# Patient Record
Sex: Female | Born: 1945 | Race: Asian | Hispanic: No | Smoking: Never smoker
Health system: Southern US, Community
[De-identification: ages and names within clinical notes are randomized; demographics above are authoritative.]

## PROBLEM LIST (undated history)

## (undated) DIAGNOSIS — K219 Gastro-esophageal reflux disease without esophagitis: Secondary | ICD-10-CM

## (undated) DIAGNOSIS — I10 Essential (primary) hypertension: Secondary | ICD-10-CM

## (undated) DIAGNOSIS — E785 Hyperlipidemia, unspecified: Secondary | ICD-10-CM

---

## 2018-06-18 ENCOUNTER — Emergency Department (HOSPITAL_COMMUNITY): Payer: Medicare Other

## 2018-06-18 ENCOUNTER — Observation Stay (HOSPITAL_COMMUNITY)
Admission: EM | Admit: 2018-06-18 | Discharge: 2018-06-19 | Disposition: A | Discharge status: Home / Self Care | Payer: Medicare Other | Attending: Internal Medicine | Admitting: Internal Medicine

## 2018-06-18 ENCOUNTER — Encounter (HOSPITAL_COMMUNITY): Payer: Self-pay | Admitting: *Deleted

## 2018-06-18 ENCOUNTER — Other Ambulatory Visit: Payer: Self-pay

## 2018-06-18 DIAGNOSIS — K219 Gastro-esophageal reflux disease without esophagitis: Secondary | ICD-10-CM | POA: Diagnosis not present

## 2018-06-18 DIAGNOSIS — E785 Hyperlipidemia, unspecified: Secondary | ICD-10-CM | POA: Diagnosis not present

## 2018-06-18 DIAGNOSIS — I10 Essential (primary) hypertension: Secondary | ICD-10-CM | POA: Diagnosis not present

## 2018-06-18 DIAGNOSIS — Z79899 Other long term (current) drug therapy: Secondary | ICD-10-CM | POA: Diagnosis not present

## 2018-06-18 DIAGNOSIS — E7800 Pure hypercholesterolemia, unspecified: Secondary | ICD-10-CM

## 2018-06-18 DIAGNOSIS — R0602 Shortness of breath: Secondary | ICD-10-CM | POA: Insufficient documentation

## 2018-06-18 DIAGNOSIS — R079 Chest pain, unspecified: Secondary | ICD-10-CM | POA: Diagnosis present

## 2018-06-18 DIAGNOSIS — R001 Bradycardia, unspecified: Secondary | ICD-10-CM | POA: Insufficient documentation

## 2018-06-18 DIAGNOSIS — R0789 Other chest pain: Secondary | ICD-10-CM

## 2018-06-18 DIAGNOSIS — I4510 Unspecified right bundle-branch block: Secondary | ICD-10-CM | POA: Diagnosis not present

## 2018-06-18 HISTORY — DX: Essential (primary) hypertension: I10

## 2018-06-18 HISTORY — DX: Hyperlipidemia, unspecified: E78.5

## 2018-06-18 HISTORY — DX: Gastro-esophageal reflux disease without esophagitis: K21.9

## 2018-06-18 LAB — BASIC METABOLIC PANEL
Anion gap: 11 (ref 5–15)
BUN: 15 mg/dL (ref 8–23)
CO2: 23 mmol/L (ref 22–32)
CREATININE: 0.8 mg/dL (ref 0.44–1.00)
Calcium: 9.6 mg/dL (ref 8.9–10.3)
Chloride: 103 mmol/L (ref 98–111)
Glucose, Bld: 111 mg/dL — ABNORMAL HIGH (ref 70–99)
POTASSIUM: 3.7 mmol/L (ref 3.5–5.1)
SODIUM: 137 mmol/L (ref 135–145)

## 2018-06-18 LAB — CBC
HCT: 37.2 % (ref 36.0–46.0)
Hemoglobin: 12 g/dL (ref 12.0–15.0)
MCH: 26.8 pg (ref 26.0–34.0)
MCHC: 32.3 g/dL (ref 30.0–36.0)
MCV: 83.2 fL (ref 78.0–100.0)
PLATELETS: 255 10*3/uL (ref 150–400)
RBC: 4.47 MIL/uL (ref 3.87–5.11)
RDW: 12.5 % (ref 11.5–15.5)
WBC: 5.9 10*3/uL (ref 4.0–10.5)

## 2018-06-18 LAB — I-STAT TROPONIN, ED
TROPONIN I, POC: 0 ng/mL (ref 0.00–0.08)
Troponin i, poc: 0 ng/mL (ref 0.00–0.08)

## 2018-06-18 LAB — HEMOGLOBIN A1C
HEMOGLOBIN A1C: 5.3 % (ref 4.8–5.6)
MEAN PLASMA GLUCOSE: 105.41 mg/dL

## 2018-06-18 MED ORDER — ENOXAPARIN SODIUM 40 MG/0.4ML ~~LOC~~ SOLN
40.0000 mg | SUBCUTANEOUS | Status: DC
  Administered 2018-06-18: 40 mg via SUBCUTANEOUS
  Filled 2018-06-18 (×2): qty 0.4

## 2018-06-18 MED ORDER — AMLODIPINE BESYLATE 5 MG PO TABS
5.0000 mg | ORAL_TABLET | Freq: Every day | ORAL | Status: DC
  Administered 2018-06-19: 5 mg via ORAL
  Filled 2018-06-18: qty 1

## 2018-06-18 MED ORDER — GI COCKTAIL ~~LOC~~
30.0000 mL | Freq: Once | ORAL | Status: AC
  Administered 2018-06-18: 30 mL via ORAL
  Filled 2018-06-18: qty 30

## 2018-06-18 MED ORDER — PANTOPRAZOLE SODIUM 40 MG PO TBEC
40.0000 mg | DELAYED_RELEASE_TABLET | Freq: Every day | ORAL | Status: DC
  Administered 2018-06-19: 40 mg via ORAL
  Filled 2018-06-18: qty 1

## 2018-06-18 MED ORDER — LOSARTAN POTASSIUM 50 MG PO TABS
100.0000 mg | ORAL_TABLET | Freq: Every day | ORAL | Status: DC
  Administered 2018-06-19: 100 mg via ORAL
  Filled 2018-06-18 (×2): qty 2

## 2018-06-18 MED ORDER — ATORVASTATIN CALCIUM 20 MG PO TABS
10.0000 mg | ORAL_TABLET | Freq: Every day | ORAL | Status: DC
  Administered 2018-06-19: 10 mg via ORAL
  Filled 2018-06-18: qty 1

## 2018-06-18 MED ORDER — NITROGLYCERIN 0.4 MG SL SUBL
0.4000 mg | SUBLINGUAL_TABLET | Freq: Once | SUBLINGUAL | Status: AC
  Administered 2018-06-18: 0.4 mg via SUBLINGUAL
  Filled 2018-06-18: qty 1

## 2018-06-18 MED ORDER — HYDROCHLOROTHIAZIDE 12.5 MG PO CAPS
12.5000 mg | ORAL_CAPSULE | Freq: Every day | ORAL | Status: DC
  Administered 2018-06-19: 12.5 mg via ORAL
  Filled 2018-06-18: qty 1

## 2018-06-18 MED ORDER — LOSARTAN POTASSIUM-HCTZ 100-12.5 MG PO TABS: 1.0000 | ORAL_TABLET | Freq: Every day | ORAL | Status: DC

## 2018-06-18 NOTE — Progress Notes (Signed)
Patient to unit from ED and denies current chest pain or headache. She is accompanied by her daughter and both were oriented to unit and call bell. CHG bath given, telemetry applied and CCMD notified. Patient speaks some english but is mostly given prompts and explanation by her daughter who is supportive and helpful. They deny questions and patient resting in bed.

## 2018-06-18 NOTE — Plan of Care (Signed)

## 2018-06-18 NOTE — ED Provider Notes (Signed)
[facility] EMERGENCY DEPARTMENT Provider Note   CSN: [id number] Arrival date & time: 06/18/18  1202     History   Chief Complaint Chief Complaint  Patient presents with  . Chest Pain    HPI Natalie Dorsey is a 72 y.o. female.  HPI 72 year old female with history of hypertension, hyperlipidemia, and GERD presents to the emergency department today for evaluation of chest tightness and shortness of breath.  She is accompanied by her daughter who helps to translate for her.  Patient and daughter declined any formal translator.  She has been having chest tightness for the past 3 or 4 days.  Is typically active and works out in her yard.  She has been having shortness of breath with exertion recently.  Tightness radiates to her left shoulder.  Has had no nausea, vomiting or diaphoresis associated with this.  No abdominal pain. No leg swelling.  No history of DVT or PE.  No history of malignancy.  Has not been compliant with her home medications including atorvastatin, amlodipine, and losartan.  No cough or fever.  No sick exposures.  Did travel to [place] approximately 1 month ago and drove by car.  No pleuritic pain.  Nothing seems to make it better.  Exertion makes it worse.  Does not smoke.  No history of pulmonary disease including COPD. States does not feel like her reflux. Lives with daughter now. No family physician in this area.   Past Medical History:  Diagnosis Date  . GERD (gastroesophageal reflux disease)   . Hyperlipemia   . Hypertension     There are no active problems to display for this patient.   History reviewed. No pertinent surgical history.   OB History   None      Home Medications    Prior to Admission medications   Not on File    Family History History reviewed. No pertinent family history.  Social History Social History   Tobacco Use  . Smoking status: Never Smoker  . Smokeless tobacco: Never Used  Substance Use Topics    . Alcohol use: Not on file  . Drug use: Not on file     Allergies   Patient has no known allergies.   Review of Systems Review of Systems  Constitutional: Negative for chills and fever.  HENT: Negative for congestion and sore throat.   Eyes: Negative for visual disturbance.  Respiratory: Positive for shortness of breath. Negative for cough.   Cardiovascular: Positive for chest pain (tightness throughout). Negative for palpitations and leg swelling.  Gastrointestinal: Negative for abdominal pain, diarrhea, nausea and vomiting.  Genitourinary: Negative for dysuria and hematuria.  Musculoskeletal: Negative for back pain and neck pain.  Skin: Negative for color change and rash.  Neurological: Negative for weakness and headaches.  All other systems reviewed and are negative.    Physical Exam Updated Vital Signs BP (!) 111/54   Pulse (!) 55   Temp 97.8 F (36.6 C) (Oral)   Resp 15   Ht 5\' 2"  (1.575 m)   Wt 54.4 kg   SpO2 95%   BMI 21.95 kg/m   Physical Exam  Constitutional: She appears well-developed and well-nourished.  Non-toxic appearance. She does not appear ill. No distress.  HENT:  Head: Normocephalic and atraumatic.  Eyes: Conjunctivae are normal.  Neck: Neck supple.  Cardiovascular: Regular rhythm and intact distal pulses. Exam reveals no gallop and no friction rub.  No murmur heard. Pulmonary/Chest: Effort normal and breath sounds normal.  No respiratory distress. She has no decreased breath sounds. She has no wheezes. She has no rales.  Abdominal: Soft. She exhibits no distension. There is no tenderness.  Musculoskeletal: She exhibits no edema.       Right lower leg: She exhibits no edema.       Left lower leg: She exhibits no edema.  Neurological: She is alert.  Skin: Skin is warm and dry.  Psychiatric: She has a normal mood and affect.  Nursing note and vitals reviewed.    ED Treatments / Results  Labs (all labs ordered are listed, but only abnormal  results are displayed) Labs Reviewed  BASIC METABOLIC PANEL - Abnormal; Notable for the following components:      Result Value   Glucose, Bld 111 (*)    All other components within normal limits  CBC  I-STAT TROPONIN, ED  I-STAT TROPONIN, ED    EKG EKG Interpretation  Date/Time:  Tuesday June 18 2018 12:11:44 EDT Ventricular Rate:  71 PR Interval:  206 QRS Duration: 98 QT Interval:  406 QTC Calculation: 441 R Axis:   -26 Text Interpretation:  Normal sinus rhythm Incomplete right bundle branch block Nonspecific T wave abnormality Abnormal ECG No old tracing to compare Confirmed by Eber HongMiller, Brian ([id number]) on 06/18/2018 2:56:01 PM   Radiology Dg Chest 2 View  Result Date: 06/18/2018 CLINICAL DATA:  Chest pain, palpitations, left shoulder pain intermittently for the past several days with increased severity today associated with shortness of breath. Nonsmoker. EXAM: CHEST - 2 VIEW COMPARISON:  None. FINDINGS: The lungs are well-expanded and clear. The heart and pulmonary vascularity are normal. The mediastinum is normal in width. There is calcification in the wall of the aortic arch. There is no pleural effusion. The bony thorax exhibits no acute abnormality. IMPRESSION: There is no active cardiopulmonary disease. Electronically Signed   By: David  SwazilandJordan M.D.   On: 06/18/2018 13:10    Procedures Procedures (including critical care time)  Medications Ordered in ED Medications  nitroGLYCERIN (NITROSTAT) SL tablet 0.4 mg (has no administration in time range)  gi cocktail (Maalox,Lidocaine,Donnatal) (has no administration in time range)     Initial Impression / Assessment and Plan / ED Course  I have reviewed the triage vital signs and the nursing notes.  Pertinent labs & imaging results that were available during my care of the patient were reviewed by me and considered in my medical decision making (see chart for details).    72 year old female with history of hypertension,  hyperlipidemia, and GERD presents to the emergency department today for evaluation of chest tightness and shortness of breath.  Patient afebrile and hemodynamically stable at presentation.  Continuing to complain of chest tightness.  She is clear to auscultation and moving good air bilaterally.  I do not think patient is having any sort of reactive airway disease or COPD.  Carries no formal lung diagnoses or COPD.  Does not smoke.  ECG reviewed that shows right approximate 70, sinus rhythm, normal axis, intervals within normal limits, no significant ST elevations or depressions.  Does have nonspecific T wave abnormality's particularly in lead V3 and V2.  No other significant antibodies appreciated.  No prior ECGs available for comparison.  Patient is high risk by HEART pathway score of 5 and has had no provocative testing such as stress testing in the past.  Will obtain delta troponins and plan for more provocative testing pending work-up.  Chest x-ray was reviewed with no acute cardiac or pulmonary  antibodies.  No focal consolidation and no infectious signs or symptoms to suggest pneumonia.  No pneumothorax.  No effusions.  No widening of mediastinum.  History and exam not consistent with aortic dissection.  She has no leg swelling, no tachycardia, no tachypnea, no hypoxia or other findings and atypical presentation for PE.  Very low suspicion for PE at this time.  Labs from quick look process reviewed including CBC and BMP each of which are unremarkable.  Troponin obtained that is undetectable.  Delta troponin obtained and also remains undetectable.  No clear etiology for patient's symptoms at this time.  Will admit for further work-up given age and comorbidities with high risk HEART pathway for ACS.   Case and plan of care discussed with Dr. Hyacinth MeekerMiller.   Final Clinical Impressions(s) / ED Diagnoses   Final diagnoses:  Shortness of breath  Chest tightness    ED Discharge Orders    None         Rigoberto Noelickens, Jisele Price, MD 06/18/18 2008    Eber HongMiller, Brian, MD 06/19/18 1019

## 2018-06-18 NOTE — ED Provider Notes (Signed)
I saw and evaluated the patient, reviewed the resident's note and I agree with the findings and plan.  Pertinent History: Heart Pathway of 5, no hx of provocative testing, has had some intermittent SOB, and CP with radiation to arms.  States that she does have acid reflux but this feels different, she does not have radiation to the arms with the acid reflux.  She does have active acid reflux as well and is taking acid reflux medications intermittently.  The daughter who is an additional historian states that the patient has been living with her since December and only over the last couple of weeks and started to complain of increased amounts of chest tightness with shortness of breath and radiation of the left shoulder.  Sometimes it is at night, sometimes during the day, not exactly exertional either.  Pertinent Exam findings: mild bradycarida, no murmurs, no RUQ ttp, no edema, no JVD  EKG shows some non specific T waves.  There is nothing on my exam of concern however she does have a high heart pathway score and increased risk and likely would need provocative testing prior to discharge.  I personally interpreted the EKG as well as the resident and agree with the interpretation on the resident's chart.  Final diagnoses:  Shortness of breath  Chest tightness      Eber HongMiller, Abbeygail Igoe, MD 06/19/18 1019

## 2018-06-18 NOTE — ED Notes (Signed)
Pt denies any further CP at this time  

## 2018-06-18 NOTE — ED Triage Notes (Signed)
Pt in c/o chest pain with palpitations and left shoulder pain that has been intermittent for the last few days, worse today, reports shortness of breath as well, no distress noted

## 2018-06-18 NOTE — H&P (Signed)
Date: 06/18/2018               Patient Name:  Natalie Dorsey Tibbett MRN: [id number]  DOB: [birth year] Age / Sex: 72 y.o., female   PCP: Patient, No Pcp Per         Medical Service: Internal Medicine Teaching Service         Attending Physician: Dr. Doneen PoissonKlima, Lawrence, MD    First Contact: Dr. Karilyn Cotaehman Pager: [phone]  Second Contact: Dr. Caron PresumeHelberg Pager: [phone]       After Hours (After 5p/  First Contact Pager: [phone]  weekends / holidays): Second Contact Pager: [phone]   Chief Complaint: Chest tightness  History of Present Illness:   [name], Quintin AltoLakhone is a Laotian speaking 72 yo F w/ PMH of GERD, HLD, HTN presenting to ED with complain of chest tightness. Patient was interviewed in ED with her daughter acting as interpreter. Chest tightness started 4 days prior while she was working in the garden. She is usually able to tolerate exercise w/o chest pain, dyspnea or palpitations. Works in the garden for 2-3 hours without rest. She tried taking her reflux medication (Prevacid 30mg ) and her pain resolved. Since then she had no reoccurence until Monday night when she laid down to watch TV after a late meal. She experienced her chest tightness again and came to the ED today because she was concerned. She describes it as intermittent chest tightness, not pain. Accompanied with bitter test in back of her mouth. Denies any nausea, vomiting. She states the tightness radiates to the back of her left shoulder from left upper chest. Denies any F/N/V/D/C.   Denies cough, pain with deep inspiration, headache, light-headedness.  Meds:  Current Meds  Medication Sig  . amLODipine (NORVASC) 5 MG tablet Take 5 mg by mouth daily.  Marland Kitchen. atorvastatin (LIPITOR) 10 MG tablet Take 10 mg by mouth daily.  . Cholecalciferol (VITAMIN D-3 PO) Take 1 capsule by mouth every 7 (seven) days.  Marland Kitchen. ibuprofen (ADVIL,MOTRIN) 200 MG tablet Take 200-400 mg by mouth every 6 (six) hours as needed (for headaches).  .  lansoprazole (PREVACID) 30 MG capsule Take 30 mg by mouth at bedtime as needed (for reflux).   Marland Kitchen. losartan-hydrochlorothiazide (HYZAAR) 100-12.5 MG tablet Take 1 tablet by mouth daily.  . Omega-3 Fatty Acids (FISH OIL PO) Take 1 capsule by mouth 2 (two) times a week.   Allergies: Allergies as of 06/18/2018  . (No Known Allergies)   Past Medical History:  Diagnosis Date  . GERD (gastroesophageal reflux disease)   . Hyperlipemia   . Hypertension     Family History: No known family history of cardiac disease  Social History: Lives at home, works in the garden often. Denies any smoking use, alcohol use, IVDU  Review of Systems: A complete ROS was negative except as per HPI.   Physical Exam: Blood pressure 125/65, pulse (!) 53, temperature 97.8 F (36.6 C), temperature source Oral, resp. rate 14, height 5\' 2"  (1.575 m), weight 54.4 kg, SpO2 98 %.  Physical Exam  Constitutional: She is oriented to person, place, and time and well-developed, well-nourished, and in no distress. No distress.  HENT:  Head: Normocephalic and atraumatic.  Mouth/Throat: Oropharynx is clear and moist. No oropharyngeal exudate.  Eyes: Pupils are equal, round, and reactive to light. Conjunctivae and EOM are normal. No scleral icterus.  Neck: Normal range of motion. Neck supple. No JVD present. No thyromegaly present.  Cardiovascular: Normal rate, regular rhythm, normal heart sounds  and intact distal pulses.  Pulmonary/Chest: Effort normal and breath sounds normal. No respiratory distress. She has no wheezes. She has no rales. She exhibits no tenderness (No point tenderness to palpation).  Abdominal: Soft. Bowel sounds are normal. She exhibits no distension. There is no tenderness. There is no guarding.  Musculoskeletal: Normal range of motion. She exhibits no edema, tenderness or deformity.  Neurological: She is alert and oriented to person, place, and time. No cranial nerve deficit.  Skin: Skin is warm and dry.  No rash noted. She is not diaphoretic. No erythema.   EKG: personally reviewed my interpretation is normal sinus rhythm, no ST elevation or depression. No T wave abnormality.   CXR: personally reviewed my interpretation is normal chest X-ray  Assessment & Plan by Problem: Active Problems:   Chest pain     Atypical Chest Pain 2/2 Reflux vs ACS vs MSK [name] is a 72 yo F w/ PMH of GERD, HTN, HLD presenting to ED with acute intermittent atypical chest pain. Her symptom onset with laying supine after dinner and resolution with Prevacid makes reflux related chest pain likely. So far troponin has been negative x2. No ST elevation on EKG. Currently resolved. Vitals stable - Admit for observation while awaiting 3rd troponin - Start Protonix 40mg  daily - Start GI Cocktail Maalox, Lidocaine, Donnatal) 30mL  HLD - c/w home med: lipitor 10mg   HTN - c/w home med: amlodipine 5mg , Hyzaar 100-12.5mg   DVT prophx: Lovenox Diet: Regular Code: FULL  Dispo: Admit patient to Observation with expected length of stay less than 2 midnights.  Signed: Levora DredgeHelberg, Justin, MD 06/18/2018, 5:12 PM  Pager: My Pager: [phone]  Judeth CornfieldJoshua Tawona Filsinger, PGY1 Pager: [phone]

## 2018-06-19 DIAGNOSIS — K219 Gastro-esophageal reflux disease without esophagitis: Secondary | ICD-10-CM

## 2018-06-19 DIAGNOSIS — I10 Essential (primary) hypertension: Secondary | ICD-10-CM

## 2018-06-19 DIAGNOSIS — E785 Hyperlipidemia, unspecified: Secondary | ICD-10-CM | POA: Diagnosis not present

## 2018-06-19 DIAGNOSIS — I440 Atrioventricular block, first degree: Secondary | ICD-10-CM

## 2018-06-19 DIAGNOSIS — R0789 Other chest pain: Secondary | ICD-10-CM

## 2018-06-19 DIAGNOSIS — E7800 Pure hypercholesterolemia, unspecified: Secondary | ICD-10-CM

## 2018-06-19 DIAGNOSIS — R0602 Shortness of breath: Secondary | ICD-10-CM | POA: Diagnosis not present

## 2018-06-19 DIAGNOSIS — Z79899 Other long term (current) drug therapy: Secondary | ICD-10-CM

## 2018-06-19 LAB — TROPONIN I: Troponin I: <0.03 ng/mL (ref <0.03)

## 2018-06-19 MED ORDER — RANITIDINE HCL 300 MG PO TABS: 300.0000 mg | ORAL_TABLET | Freq: Every day | ORAL | 2 refills | Status: AC

## 2018-06-19 MED ORDER — CALCIUM CARBONATE ANTACID 500 MG PO CHEW: 1.0000 | CHEWABLE_TABLET | Freq: Every day | ORAL | 3 refills | Status: AC

## 2018-06-19 MED ORDER — PANTOPRAZOLE SODIUM 40 MG PO TBEC: 40.0000 mg | DELAYED_RELEASE_TABLET | Freq: Every day | ORAL | 2 refills | Status: DC

## 2018-06-19 NOTE — Discharge Summary (Signed)
Name: Natalie Dorsey MRN: [id number] DOB: [birth year] 72 y.o. PCP: Patient, No Pcp Per  Date of Admission: 06/18/2018 12:04 PM Date of Discharge: 06/19/18 Attending Physician: Doneen PoissonKlima, Lawrence, MD  Discharge Diagnosis: 1. GERD  Discharge Medications: Allergies as of 06/19/2018   No Known Allergies     Medication List    STOP taking these medications   lansoprazole 30 MG capsule Commonly known as:  PREVACID     TAKE these medications   amLODipine 5 MG tablet Commonly known as:  NORVASC Take 5 mg by mouth daily.   atorvastatin 10 MG tablet Commonly known as:  LIPITOR Take 10 mg by mouth daily.   calcium carbonate 500 MG chewable tablet Commonly known as:  TUMS - dosed in mg elemental calcium Chew 1 tablet (200 mg of elemental calcium total) by mouth daily.   FISH OIL PO Take 1 capsule by mouth 2 (two) times a week.   ibuprofen 200 MG tablet Commonly known as:  ADVIL,MOTRIN Take 200-400 mg by mouth every 6 (six) hours as needed (for headaches).   losartan-hydrochlorothiazide 100-12.5 MG tablet Commonly known as:  HYZAAR Take 1 tablet by mouth daily.   ranitidine 300 MG tablet Commonly known as:  ZANTAC Take 1 tablet (300 mg total) by mouth at bedtime.   VITAMIN D-3 PO Take 1 capsule by mouth every 7 (seven) days.       Disposition and follow-up:   [facility]  At the hospital follow up visit please address:  1.  GERD: assess ranitidine compliance and effectiveness  2.  Labs / imaging needed at time of follow-up: none  3.  Pending labs/ test needing follow-up: none  Follow-up Appointments:  Patient, No Pcp Per  Hospital Course by problem list: 1. GERD: Patient presented with intermittent atypical chest pain for the past four days. EKG was normal and troponin's were negative x3. Likely GERD causing her symptoms. Patient responded well to PPI and GI cocktail therapy.  She has been taking home med prevacid PRN. Stressed importance of taking medication everyday. Daughter was concerned over side effects of bone fractures caused by PPIs, so discharged on ranitidine 300 mg QD and tums PRN.    Discharge Vitals:   BP 113/64 (BP Location: Left Arm)   Pulse 61   Temp 97.7 F (36.5 C) (Oral)   Resp (!) 22   Ht 5\' 2"  (1.575 m)   Wt 55.2 kg   SpO2 98%   BMI 22.26 kg/m   Pertinent Labs, Studies, and Procedures:   Troponin (Point of Care Test) Recent Labs    06/18/18 1608  TROPIPOC 0.00   CBC Latest Ref Rng & Units 06/18/2018  WBC 4.0 - 10.5 K/uL 5.9  Hemoglobin 12.0 - 15.0 g/dL 44.012.0  Hematocrit 10.236.0 - 46.0 % 37.2  Platelets 150 - 400 K/uL 255   CMP Latest Ref Rng & Units 06/18/2018  Glucose 70 - 99 mg/dL 725(D111(H)  BUN 8 - 23 mg/dL 15  Creatinine 6.640.44 - 4.031.00 mg/dL 4.740.80  Sodium 259135 - 563145 mmol/L 137  Potassium 3.5 - 5.1 mmol/L 3.7  Chloride 98 - 111 mmol/L 103  CO2 22 - 32 mmol/L 23  Calcium 8.9 - 10.3 mg/dL 9.6     Discharge Instructions: Discharge Instructions    Call MD for:  difficulty breathing, headache or visual disturbances   Complete by:  As directed    Call MD for:  extreme fatigue  Complete by:  As directed    Call MD for:  hives   Complete by:  As directed    Call MD for:  persistant dizziness or light-headedness   Complete by:  As directed    Call MD for:  persistant nausea and vomiting   Complete by:  As directed    Call MD for:  redness, tenderness, or signs of infection (pain, swelling, redness, odor or green/yellow discharge around incision site)   Complete by:  As directed    Call MD for:  severe uncontrolled pain   Complete by:  As directed    Call MD for:  temperature >100.4   Complete by:  As directed    Diet - low sodium heart healthy   Complete by:  As directed    Discharge instructions   Complete by:  As directed    Natalie Dorsey,  Please note the following changes to your medications:  -STOP taking  Prevacid 30 mg daily -START taking Protonix 40 mg daily   Please follow up with your PCP.  Thank you for allowing us to be a part of your care!   Increase activity slowly   Complete by:  As directed       Signed: Jaci StandardRehman, Areeg N, DO 06/19/2018, 11:42 AM   Pager: [phone]

## 2018-06-19 NOTE — Progress Notes (Signed)
Internal Medicine Attending  Date: 06/19/2018  Patient name: Natalie HurterLakhone Ehler Medical record number: [id number] Date of birth: 12-19-1945 Age: 72 y.o. Gender: female  I saw and evaluated the patient. I reviewed the resident's note by Dr. Karilyn Cotaehman and I agree with the resident's findings and plans as documented in her progress note.  Please see my H&P dated 06/19/2018 the specifics of my evaluation, assessment, and plan from earlier in the day.

## 2018-06-19 NOTE — Progress Notes (Signed)
   Subjective: Patient doing well this AM but did have some CP overnight that radiated to the back. She did have some SOB as well. But denies N/V, diaphoresis. Discussed that her EKG and troponin were negative. Recommended continuing daily PPI. Advised that even though this episode does not appear cardiac in origin, if the CP recurs or prevents her from exercising we would recommend seeking medical care. Daughter and patient at bedside voice understanding. All questions and concerns addressed.   Objective: Vital signs in last 24 hours: Vitals:   06/18/18 1849 06/18/18 2127 06/19/18 0534 06/19/18 0917  BP: 103/64 98/61 109/64 113/64  Pulse: (!) 53 (!) 56 (!) 57 61  Resp: 16 15 19  (!) 22  Temp: 98 F (36.7 C) 97.6 F (36.4 C) 97.7 F (36.5 C)   TempSrc: Oral Oral Oral   SpO2: 97% 96% 94% 98%  Weight: 57.4 kg  55.2 kg   Height: 5\' 2"  (1.575 m)      Physical Exam  Constitutional: She is oriented to person, place, and time and well-developed, well-nourished, and in no distress.  Cardiovascular: Normal rate, regular rhythm and normal heart sounds.  No murmur heard. Pulmonary/Chest: Effort normal and breath sounds normal. No respiratory distress. She has no wheezes. She exhibits no tenderness.  Abdominal: Soft. Bowel sounds are normal. She exhibits no distension. There is no tenderness. There is no guarding.  Neurological: She is alert and oriented to person, place, and time.    Assessment/Plan:  Active Problems:   Chest pain   GERD (gastroesophageal reflux disease)   Natalie Meyernsisiengmay is a 72 yo F w/ PMH of GERD, HTN, HLD presenting to ED with acute intermittent atypical chest pain for the past four days.  Atypical Chest Pain 2/2 Reflux:  Troponin negative x3. EKG unremarkable. She experienced some chest pain over night that radiated to her back but unlikely cardiac in nature and related to her GERD. Daughter expressed concerns over sides effects of bone fractures with PPI use.  Will prescribe ranitidine 300 mg QD.  - start ranitidine 300 mg QD - take TUMS PRN   HLD - continue home med lipitor 10mg   HTN - continue home meds amlodipine 5mg , Hyzaar 100-12.5mg   Dispo: Patient is medically stable for discharge today.  Keeshawn Fakhouri N, DO 06/19/2018, 11:35 AM Pager: [phone]

## 2018-06-19 NOTE — Progress Notes (Deleted)
   Subjective: [name] was seen bedside with her daughter this morning. She was offered a Nurse, learning disabilitytranslator but she said she spoke some English and her daughter could assist. She reported having some chest pain overnight that radiated to her back and some SOB. She denied pain radiating to her arm or jaw, sweating, abdominal pain, or N/V. It was explained to her that this was unlikely cardiac in nature and related to her GERD, especially due to her response on PPI therapy. Her daughter was concerned of ulcers and it was explained this was unlikely due to her symptoms and response on therapy.   Objective:  Vital signs in last 24 hours: Vitals:   06/18/18 1849 06/18/18 2127 06/19/18 0534 06/19/18 0917  BP: 103/64 98/61 109/64 113/64  Pulse: (!) 53 (!) 56 (!) 57 61  Resp: 16 15 19  (!) 22  Temp: 98 F (36.7 C) 97.6 F (36.4 C) 97.7 F (36.5 C)   TempSrc: Oral Oral Oral   SpO2: 97% 96% 94% 98%  Weight: 57.4 kg  55.2 kg   Height: 5\' 2"  (1.575 m)      Physical Exam  Constitutional: She is oriented to person, place, and time and well-developed, well-nourished, and in no distress.  Cardiovascular: Normal rate, regular rhythm and normal heart sounds.  No murmur heard. Pulmonary/Chest: Effort normal and breath sounds normal. No respiratory distress. She has no wheezes. She exhibits no tenderness.  Abdominal: Soft. Bowel sounds are normal. She exhibits no distension. There is no tenderness. There is no guarding.  Neurological: She is alert and oriented to person, place, and time.    Assessment/Plan:  Active Problems:   Chest pain   GERD (gastroesophageal reflux disease)   [name] is a 72 yo F w/ PMH of GERD, HTN, HLD presenting to ED with acute intermittent atypical chest pain for the past four days.  Atypical Chest Pain 2/2 Reflux:  Troponin negative x3. EKG unremarkable. She experienced some chest pain over night that radiated to her back but unlikely cardiac in nature and  related to her GERD. Daughter expressed concerns over sides effects of bone fractures with PPI use. Will prescribe ranitidine 300 mg QD.  - start ranitidine 300 mg QD - take TUMS PRN   HLD - continue home med lipitor 10mg   HTN - continue home meds amlodipine 5mg , Hyzaar 100-12.5mg   Dispo: Patient is medically stable for discharge today.  Dorotea Hand N, DO 06/19/2018, 11:35 AM Pager: [phone]

## 2018-06-19 NOTE — H&P (Signed)
Internal Medicine Attending Admission Note Date: 06/19/2018  Patient name: Natalie HurterLakhone Dorsey Medical record number: [id number] Date of birth: [birth year] Age: 72 y.o. Gender: female  I saw and evaluated the patient. I reviewed the resident's note and I agree with the resident's findings and plan as documented in the resident's note.  Chief Complaint(s): Intermittent chest pain 4 days.  History - key components related to admission:  Natalie Dorsey is a 72 year old woman with a history of gastroesophageal reflux disease, essential hypertension, and hyperlipidemia who presents to the emergency department with a four-day history of intermittent chest tightness. She was in her usual state of health until 4 days prior to admission when she was out working in the garden and developed chest tightness. She usually works in the garden 2-3 hours without rest without ever experiencing chest pain. She went into the house and took Prevacid, which she takes intermittently like all of her medications, and the pain resolved. She did well until the night prior to admission when, after having a large meal and lying down to watch TV, she developed a recurrence of this chest tightness that radiated to her back. The pain was not associated with nausea, diaphoresis, dizziness, or shortness of breath. It was associated with a bitter taste in the back of her mouth. Because of concern over this pain, she presented to the emergency department the following morning and was admitted to the internal medicine teaching service for further evaluation and care.   When seen on rounds the morning following admission she stated she had an episode of chest pain while lying in bed last night. She did not inform anyone. The pain resolved spontaneously, but was similar in that it radiated to the left shoulder.  Physical Exam - key components related to admission:  Vitals:   06/18/18 2127 06/19/18 0534 06/19/18 0917 06/19/18 1145  BP:  98/61 109/64 113/64 114/65  Pulse: (!) 56 (!) 57 61 (!) 57  Resp: 15 19 (!) 22 16  Temp: 97.6 F (36.4 C) 97.7 F (36.5 C)  98.1 F (36.7 C)  TempSrc: Oral Oral  Oral  SpO2: 96% 94% 98% 96%  Weight:  55.2 kg    Height:       Gen.: Well-developed, well-nourished, woman lying comfortably in bed in no acute distress. Lungs: Clear to auscultation bilaterally without wheezes, rhonchi, rales. Heart: Regular rate and rhythm without murmurs, rubs, or gallops. Abdomen: Soft, nontender, without guarding or rebound. Extremities: Without edema.  Lab results:  Basic Metabolic Panel: Recent Labs    06/18/18 1213  NA 137  K 3.7  CL 103  CO2 23  GLUCOSE 111*  BUN 15  CREATININE 0.80  CALCIUM 9.6   CBC: Recent Labs    06/18/18 1213  WBC 5.9  HGB 12.0  HCT 37.2  MCV 83.2  PLT 255   Cardiac Enzymes: Recent Labs    06/18/18 2351  TROPONINI <0.03   Hemoglobin A1C: Recent Labs    06/18/18 1828  HGBA1C 5.3   Imaging results:   PA and lateral chest x-ray: Personally reviewed. No effusions, infiltrates, or masses.  Other results:  EKG: Personally reviewed. Normal sinus rhythm at 71 bpm, left axis deviation, first-degree AV block, no significant Q waves, no LVH by voltage, good R wave progression, no ST or T-wave changes. No changes from the subsequent ECG performed today 06/19/2018.  Assessment & Plan by Problem:  Natalie Dorsey is a 72 year old woman with a history of gastroesophageal reflux disease, essential hypertension, and  hyperlipidemia who presents to the emergency department with a four-day history of intermittent chest tightness.  Her chest pain is atypical in that it is not consistently associated with exertion, occurs with lying down as well as standing up, and was relieved by PPI therapy. Her heart score was 3 by my calculation. She ruled out for myocardial infarction with serial ECGs and troponins. Given this, and the atypical nature of her chest pain, my  suspicion for a coronary cause of this pain is low. The history is more consistent with gastroesophageal reflux, for which she was previously diagnosed, but only intermittently takes her PPI therapy. Thus, given her low risk, we feel she is stable for discharge home and assessment of her response to H2 locker therapy since she does not want to take anymore PPIs because of a concern for osteoporosis.  1) Atypical chest pain: Low risk for cardiac cause. We have initiated ranitidine 300 mg by mouth daily and stressed the importance of taking this on an every day basis. This is to assess her response to this therapy as it is not only therapeutic, but potentially diagnostic. She was also asked to take intermittent Tums should she need. If her pain were to persist despite compliance with the H2 blocker therapy, she was asked to return to the hospital for further evaluation.  2) Disposition: She was discharged home today with the above instructions. She will follow-up with her new primary care provider as are ready scheduled in early September.

## 2018-06-19 NOTE — Discharge Instructions (Signed)
[name],   Please not the following changes to your medications:  -STOP taking Prevacid 30 m g -START taking ranitidine 300 mg once a day -START taking TUMS as needed   Please disregard the instructions to start Protonix. Also, please follow up with your pcp.  Thank you for allowing us to be a part of your care.

## 2018-06-19 NOTE — Progress Notes (Signed)
Discharge instruction was given to pt's daughter, Ms Wayne SeverMelissia Hendry. All questions are answered. All patient's belonging given back to her daughter. Discharged via wheelchair accompany by pt's daughter and [facility].  Filiberto Pinksnpaneeya Chaquita Basques, RN

## 2018-07-10 ENCOUNTER — Other Ambulatory Visit: Payer: Self-pay | Admitting: Family Medicine

## 2018-07-10 DIAGNOSIS — M810 Age-related osteoporosis without current pathological fracture: Secondary | ICD-10-CM

## 2018-09-04 ENCOUNTER — Ambulatory Visit
Admission: RE | Admit: 2018-09-04 | Discharge: 2018-09-04 | Disposition: A | Discharge status: Home / Self Care | Payer: Medicare Other | Source: Ambulatory Visit | Attending: Family Medicine | Admitting: Family Medicine

## 2018-09-04 DIAGNOSIS — M810 Age-related osteoporosis without current pathological fracture: Secondary | ICD-10-CM

## 2019-01-17 ENCOUNTER — Encounter: Payer: Self-pay | Admitting: Internal Medicine
